# Patient Record
Sex: Male | Born: 1970 | Race: White | Hispanic: Yes | Smoking: Former smoker
Health system: Southern US, Community
[De-identification: ages and names within clinical notes are randomized; demographics above are authoritative.]

## PROBLEM LIST (undated history)

## (undated) HISTORY — PX: TONSILLECTOMY: SUR1361

---

## 2018-08-11 ENCOUNTER — Emergency Department: Payer: Federal, State, Local not specified - PPO

## 2018-08-11 ENCOUNTER — Emergency Department
Admission: EM | Admit: 2018-08-11 | Discharge: 2018-08-11 | Disposition: A | Discharge status: Home / Self Care | Payer: Federal, State, Local not specified - PPO | Attending: Emergency Medicine | Admitting: Emergency Medicine

## 2018-08-11 DIAGNOSIS — K5730 Diverticulosis of large intestine without perforation or abscess without bleeding: Secondary | ICD-10-CM | POA: Diagnosis not present

## 2018-08-11 DIAGNOSIS — K5792 Diverticulitis of intestine, part unspecified, without perforation or abscess without bleeding: Secondary | ICD-10-CM

## 2018-08-11 DIAGNOSIS — Z87891 Personal history of nicotine dependence: Secondary | ICD-10-CM | POA: Insufficient documentation

## 2018-08-11 DIAGNOSIS — R1030 Lower abdominal pain, unspecified: Secondary | ICD-10-CM | POA: Diagnosis present

## 2018-08-11 LAB — CBC
HCT: 43.9 % (ref 39.0–52.0)
Hemoglobin: 14.7 g/dL (ref 13.0–17.0)
MCH: 30 pg (ref 26.0–34.0)
MCHC: 33.5 g/dL (ref 30.0–36.0)
MCV: 89.6 fL (ref 80.0–100.0)
PLATELETS: 223 10*3/uL (ref 150–400)
RBC: 4.9 MIL/uL (ref 4.22–5.81)
RDW: 13.5 % (ref 11.5–15.5)
WBC: 12 10*3/uL — ABNORMAL HIGH (ref 4.0–10.5)
nRBC: 0 % (ref 0.0–0.2)

## 2018-08-11 LAB — COMPREHENSIVE METABOLIC PANEL
ALBUMIN: 4.3 g/dL (ref 3.5–5.0)
ALK PHOS: 76 U/L (ref 38–126)
ALT: 29 U/L (ref 0–44)
AST: 30 U/L (ref 15–41)
Anion gap: 8 (ref 5–15)
BUN: 20 mg/dL (ref 6–20)
CALCIUM: 9.1 mg/dL (ref 8.9–10.3)
CO2: 30 mmol/L (ref 22–32)
Chloride: 101 mmol/L (ref 98–111)
Creatinine, Ser: 1.16 mg/dL (ref 0.61–1.24)
GFR calc Af Amer: >60 mL/min (ref >60)
GFR calc non Af Amer: >60 mL/min (ref >60)
GLUCOSE: 86 mg/dL (ref 70–99)
Potassium: 3.9 mmol/L (ref 3.5–5.1)
SODIUM: 139 mmol/L (ref 135–145)
Total Bilirubin: 0.5 mg/dL (ref 0.3–1.2)
Total Protein: 7.4 g/dL (ref 6.5–8.1)

## 2018-08-11 LAB — URINALYSIS, COMPLETE (UACMP) WITH MICROSCOPIC
BACTERIA UA: NONE SEEN
BILIRUBIN URINE: NEGATIVE
Glucose, UA: NEGATIVE mg/dL
Hgb urine dipstick: NEGATIVE
KETONES UR: NEGATIVE mg/dL
LEUKOCYTES UA: NEGATIVE
Nitrite: NEGATIVE
PH: 5 (ref 5.0–8.0)
Protein, ur: NEGATIVE mg/dL
Specific Gravity, Urine: 1.019 (ref 1.005–1.030)

## 2018-08-11 LAB — LIPASE, BLOOD: Lipase: 44 U/L (ref 11–51)

## 2018-08-11 MED ORDER — IOHEXOL 300 MG/ML  SOLN
30.0000 mL | Freq: Once | INTRAMUSCULAR | Status: AC | PRN
  Administered 2018-08-11: 30 mL via ORAL
  Filled 2018-08-11: qty 30

## 2018-08-11 MED ORDER — AMOXICILLIN-POT CLAVULANATE 875-125 MG PO TABS
1.0000 | ORAL_TABLET | Freq: Once | ORAL | Status: AC
  Administered 2018-08-11: 1 via ORAL
  Filled 2018-08-11: qty 1

## 2018-08-11 MED ORDER — ONDANSETRON 4 MG PO TBDP: 4.0000 mg | ORAL_TABLET | Freq: Three times a day (TID) | ORAL | 0 refills | Status: AC | PRN

## 2018-08-11 MED ORDER — HYDROCODONE-ACETAMINOPHEN 5-325 MG PO TABS: 1.0000 | ORAL_TABLET | ORAL | 0 refills | Status: AC | PRN

## 2018-08-11 MED ORDER — AMOXICILLIN-POT CLAVULANATE 875-125 MG PO TABS: 1.0000 | ORAL_TABLET | Freq: Two times a day (BID) | ORAL | 0 refills | Status: AC

## 2018-08-11 MED ORDER — IOPAMIDOL (ISOVUE-300) INJECTION 61%
100.0000 mL | Freq: Once | INTRAVENOUS | Status: AC | PRN
  Administered 2018-08-11: 100 mL via INTRAVENOUS
  Filled 2018-08-11: qty 100

## 2018-08-11 NOTE — ED Provider Notes (Signed)
[facility] Emergency Department Provider Note  Time seen: 8:51 PM  I have reviewed the triage vital signs and the nursing notes.   HISTORY  Chief Complaint Abdominal Pain    HPI Jerome Greene is a 47 y.o. male with no significant past medical history presents to the emergency department for 3 days of lower abdominal pain.  According to the patient for the past 2 days he has had moderate dull aching pain across the entire lower abdomen somewhat worse in the right lower quadrant.  States some nausea but denies any vomiting.  Denies any diarrhea.  Positive for chills but denies any measured fever.  Describes his pain as a aching dull pain.   History reviewed. No pertinent past medical history.  There are no active problems to display for this patient.   Past Surgical History:  Procedure Laterality Date  . TONSILLECTOMY      Prior to Admission medications   Not on File    No Known Allergies  No family history on file.  Social History Social History   Tobacco Use  . Smoking status: Former Games developer  . Smokeless tobacco: Never Used  Substance Use Topics  . Alcohol use: Not on file  . Drug use: Not on file    Review of Systems Constitutional: Negative for fever positive for chills. Cardiovascular: Negative for chest pain. Respiratory: Negative for shortness of breath. Gastrointestinal: Moderate dull aching pain across the lower abdomen.  Positive for nausea but negative for vomiting or diarrhea Genitourinary: Negative for urinary compaints Musculoskeletal: Negative for musculoskeletal complaints Skin: Negative for skin complaints  Neurological: Negative for headache All other ROS negative  ____________________________________________   PHYSICAL EXAM:  VITAL SIGNS: ED Triage Vitals  Enc Vitals Group     BP 08/11/18 1951 140/90     Pulse Rate 08/11/18 1951 91     Resp 08/11/18 1951 18     Temp 08/11/18 1951 98.2 F (36.8 C)     Temp  Source 08/11/18 1951 Oral     SpO2 08/11/18 1951 100 %     Weight 08/11/18 1950 183 lb (83 kg)     Height 08/11/18 1950 5\' 7"  (1.702 m)     Head Circumference --      Peak Flow --      Pain Score 08/11/18 1950 4     Pain Loc --      Pain Edu? --      Excl. in GC? --    Constitutional: Alert and oriented. Well appearing and in no distress. Eyes: Normal exam ENT   Head: Normocephalic and atraumatic.   Mouth/Throat: Mucous membranes are moist. Cardiovascular: Normal rate, regular rhythm. No murmur Respiratory: Normal respiratory effort without tachypnea nor retractions. Breath sounds are clear Gastrointestinal: Soft, moderate lower abdominal tenderness to palpation across the entire lower abdomen somewhat worse in the right lower quadrant.  No rebound guarding or distention. Musculoskeletal: Nontender with normal range of motion in all extremities.  Neurologic:  Normal speech and language. No gross focal neurologic deficits  Skin:  Skin is warm, dry and intact.  Psychiatric: Mood and affect are normal.   ____________________________________________    RADIOLOGY  CT consistent with diverticulitis.  ____________________________________________   INITIAL IMPRESSION / ASSESSMENT AND PLAN / ED COURSE  Pertinent labs & imaging results that were available during my care of the patient were reviewed by me and considered in my medical decision making (see chart for details).  Patient presents emergency  department for lower abdominal pain over the past 2-3 days progressively worsening.  Differential this time would include appendicitis, diverticulitis, colitis, urinary tract infection. Patient's labs have resulted showing slight leukocytosis otherwise largely within normal limits.  Will obtain CT imaging to further evaluate.  Patient agreeable to plan.  CT scan consistent with diverticulitis.  We will discharge on Augmentin twice daily for 14 days, pain and nausea medication to be  used as needed.  I discussed my normal diverticulitis return precautions as well as dietary precautions.  Patient agreeable to plan of care.  ____________________________________________   FINAL CLINICAL IMPRESSION(S) / ED DIAGNOSES  Abdominal pain Acute diverticulitis.   Minna Antis, MD 08/11/18 2226

## 2018-08-11 NOTE — ED Triage Notes (Signed)
Patient c/o LRQ abdominal pain beginning Sunday. Patient denies accompanying symptoms of N/V/D. Patient is tender to palpation.

## 2020-04-24 IMAGING — CT CT ABD-PELV W/ CM
2 of 5 series · 15 of 46 positions shown, 17 images · IV contrast (APPLIED)
Comparison: None.

CLINICAL DATA: Abdominal pain beginning [REDACTED] with chills but no
fever.

EXAM:
CT ABDOMEN AND PELVIS WITH CONTRAST
TECHNIQUE: Multidetector CT imaging of the abdomen and pelvis was performed
using the standard protocol following bolus administration of
intravenous contrast.
CONTRAST:  100mL [id number] IOPAMIDOL ([id number]) INJECTION 61%

[Series 2: axial st · axial · 0.66mm/px · z∈[+582,+1057]mm · 12 of 109 slices shown, 14 images]
[im 7/109  soft-tissue]
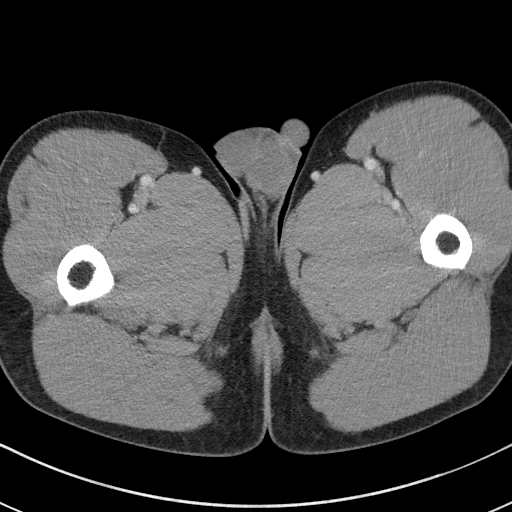
[im 7/109  bone]
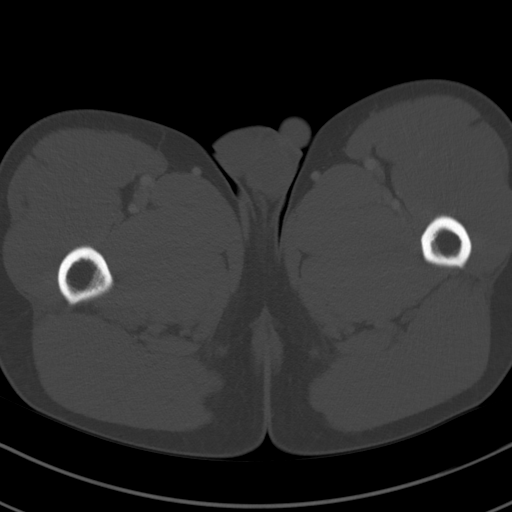
[im 20/109  soft-tissue]
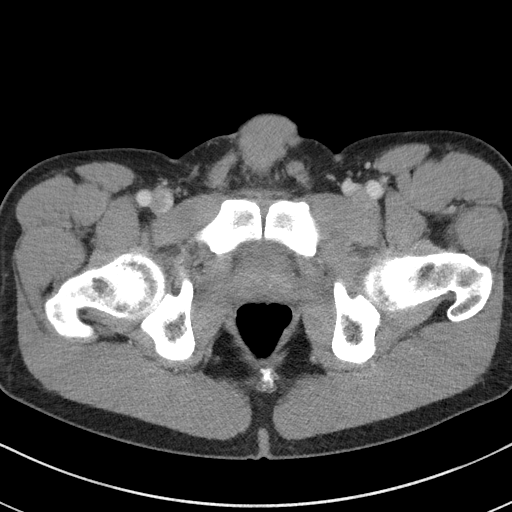
[im 26/109  soft-tissue]
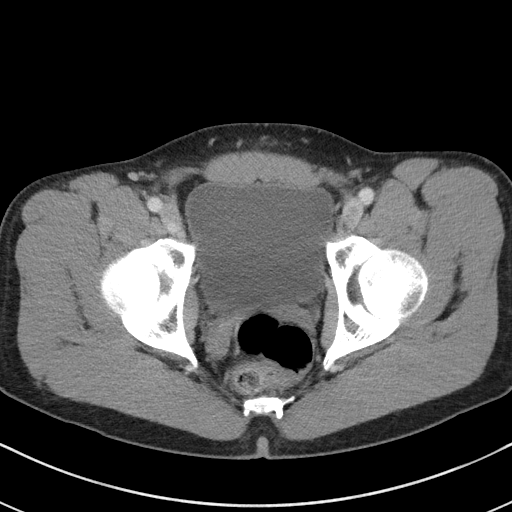
[im 32/109  soft-tissue]
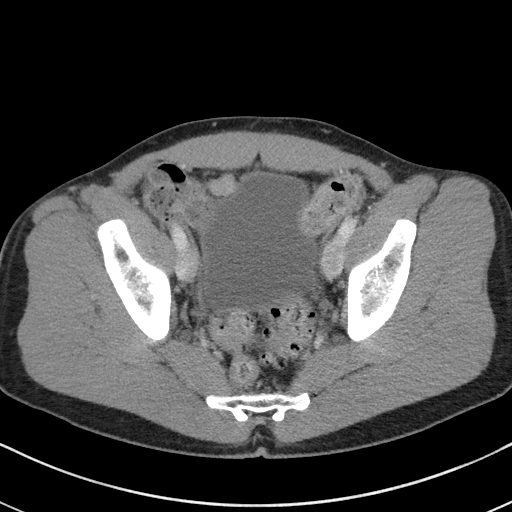
[im 45/109  soft-tissue]
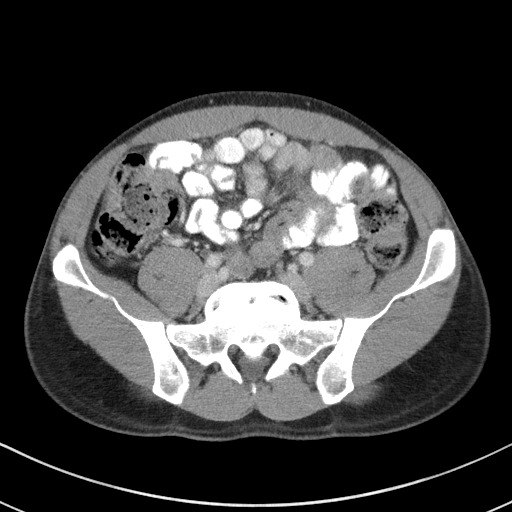
[im 51/109  soft-tissue]
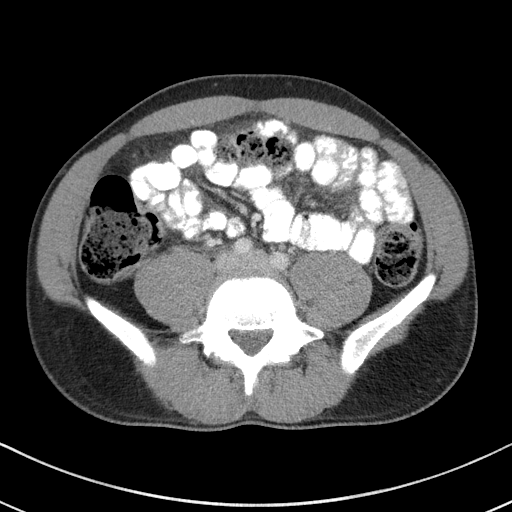
[im 58/109  soft-tissue]
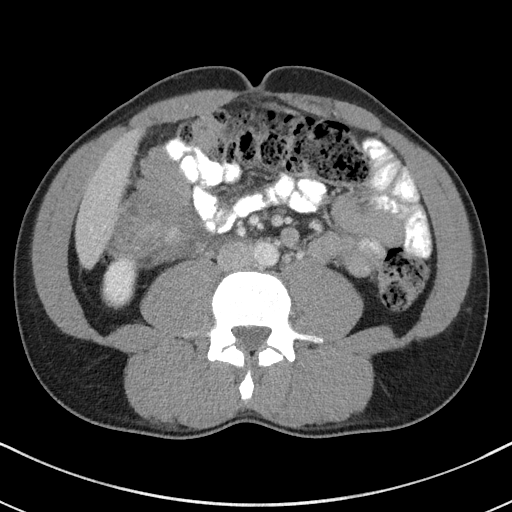
[im 70/109  soft-tissue]
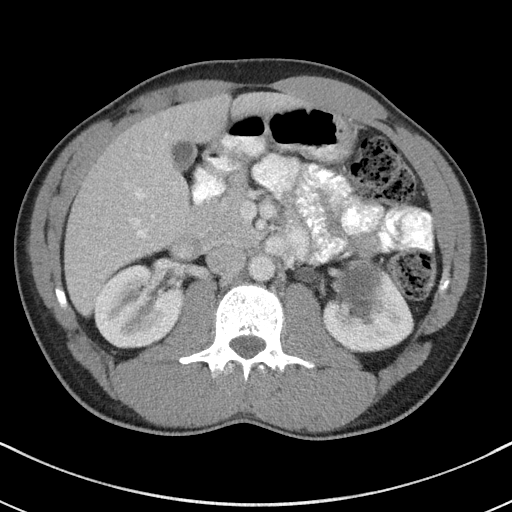
[im 77/109  soft-tissue]
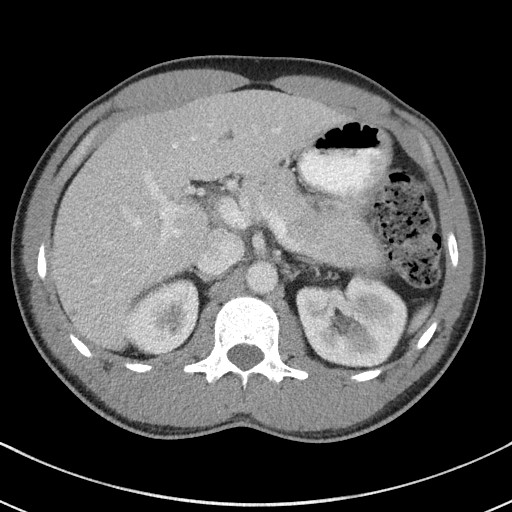
[im 77/109  bone]
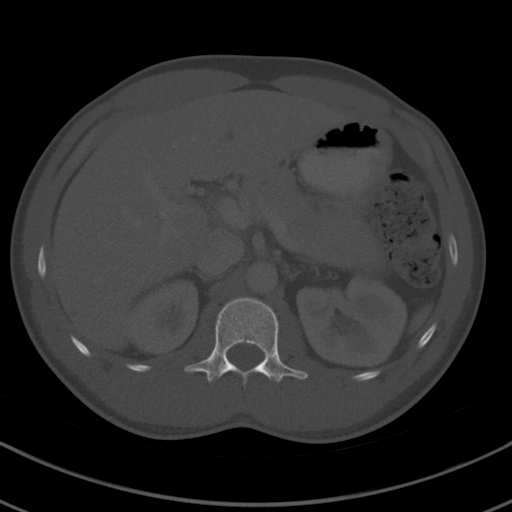
[im 83/109  soft-tissue]
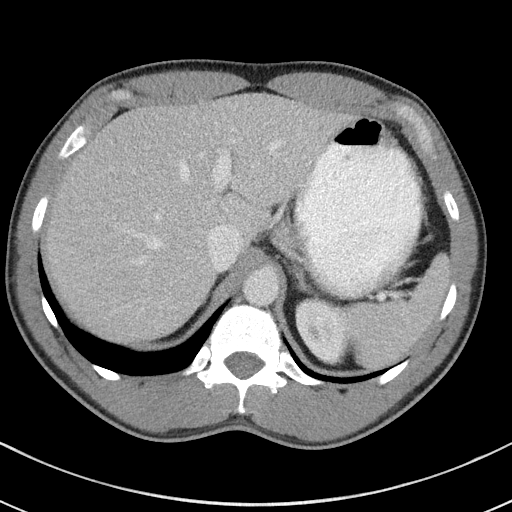
[im 96/109  soft-tissue]
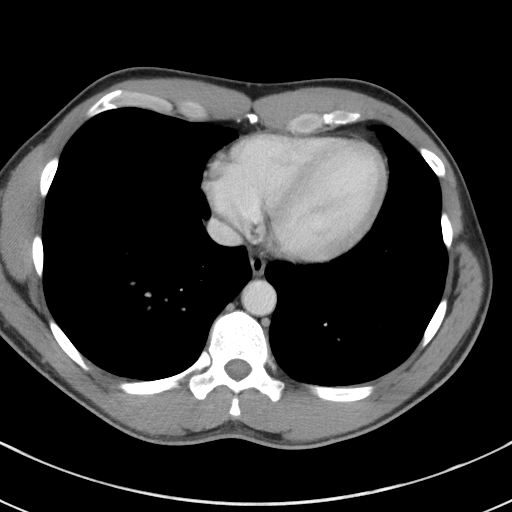
[im 102/109  soft-tissue]
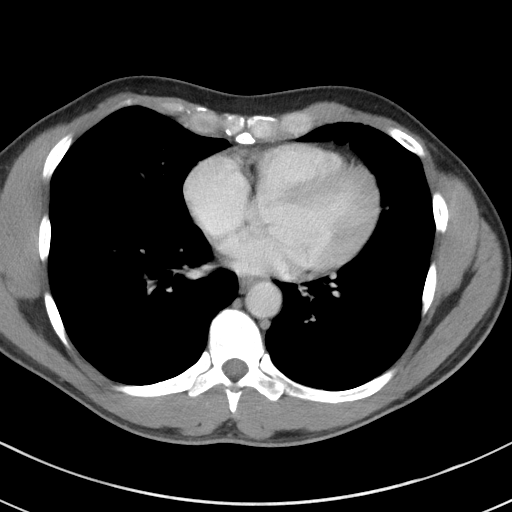

[Series 5: coronal st · coronal · 0.69mm/px · 3 of 79 slices shown]
[im 27/79  soft-tissue]
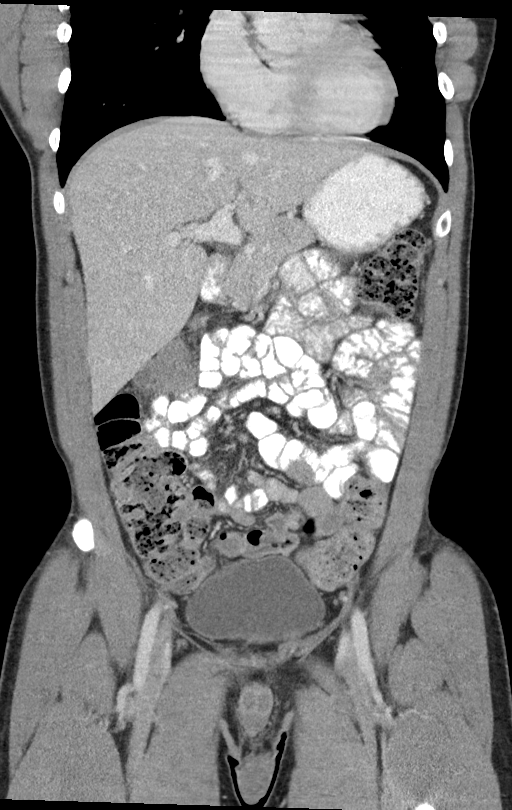
[im 35/79  soft-tissue]
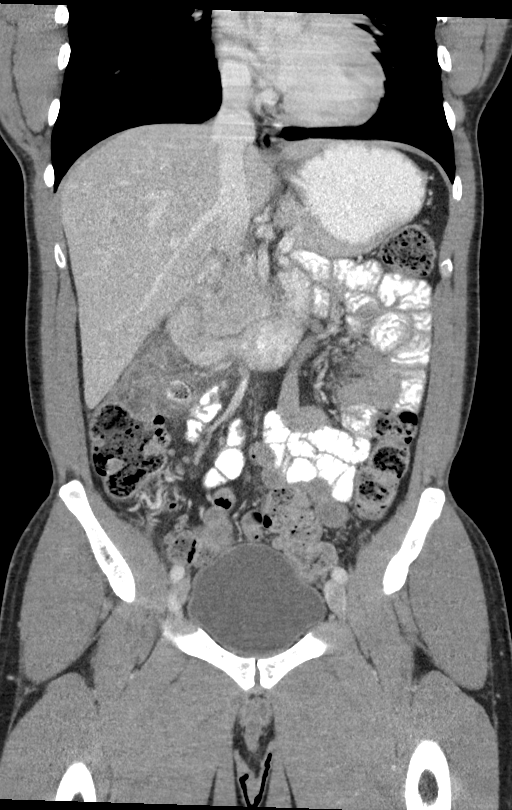
[im 44/79  soft-tissue]
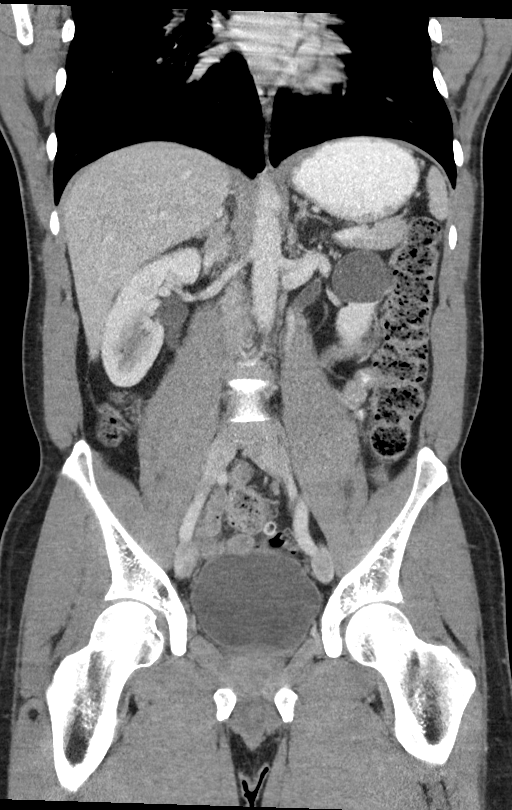

[15 of 46 positions shown; findings below may reference images not displayed]

FINDINGS: Lower chest: Lung bases are normal.

Hepatobiliary: The gallbladder, liver and biliary tree are normal.

Pancreas: Normal.

Spleen: Normal.

Adrenals/Urinary Tract: Adrenal glands are normal. Kidneys are
normal in size without hydronephrosis or nephrolithiasis. 4.6 cm
cyst over the mid pole left kidney. Ureters and bladder are normal.

Stomach/Bowel: Stomach and small bowel are normal. Appendix is
normal. Diverticulosis of the colon with mild fecal retention
throughout the colon. Wall thickening of the hepatic flexure with
associated inflamed diverticula compatible with acute
diverticulitis. No evidence of perforation or diverticular abscess.

Vascular/Lymphatic: Normal.

Reproductive: Normal.

Other: None.

Musculoskeletal: Disc disease with mild spondylosis involving the
L5-S1 level.
IMPRESSION: Colonic diverticulosis. There is evidence of acute diverticulitis
involving the hepatic flexure in the right mid abdomen. No
perforation or abscess.

4.6 cm left renal cyst.

## 2023-06-12 ENCOUNTER — Ambulatory Visit: Payer: Federal, State, Local not specified - PPO

## 2023-06-12 DIAGNOSIS — K5730 Diverticulosis of large intestine without perforation or abscess without bleeding: Secondary | ICD-10-CM | POA: Diagnosis not present

## 2023-06-12 DIAGNOSIS — Z1211 Encounter for screening for malignant neoplasm of colon: Secondary | ICD-10-CM | POA: Diagnosis present

## 2023-06-12 DIAGNOSIS — K640 First degree hemorrhoids: Secondary | ICD-10-CM | POA: Diagnosis not present

## 2023-10-16 ENCOUNTER — Other Ambulatory Visit: Payer: Self-pay | Admitting: Infectious Diseases

## 2023-10-16 DIAGNOSIS — G8929 Other chronic pain: Secondary | ICD-10-CM

## 2023-11-05 ENCOUNTER — Ambulatory Visit
Admission: RE | Admit: 2023-11-05 | Discharge: 2023-11-05 | Disposition: A | Discharge status: Home / Self Care | Payer: Federal, State, Local not specified - PPO | Source: Ambulatory Visit | Attending: Infectious Diseases | Admitting: Infectious Diseases

## 2023-11-05 DIAGNOSIS — R519 Headache, unspecified: Secondary | ICD-10-CM | POA: Insufficient documentation

## 2023-11-05 DIAGNOSIS — G8929 Other chronic pain: Secondary | ICD-10-CM | POA: Insufficient documentation
